# Patient Record
Sex: Male | Born: 1965 | Hispanic: No | Smoking: Never smoker
Health system: Southern US, Community
[De-identification: ages and names within clinical notes are randomized; demographics above are authoritative.]

## PROBLEM LIST (undated history)

## (undated) DIAGNOSIS — N189 Chronic kidney disease, unspecified: Secondary | ICD-10-CM

## (undated) DIAGNOSIS — I10 Essential (primary) hypertension: Secondary | ICD-10-CM

## (undated) HISTORY — PX: PATELLAR TENDON REPAIR: SHX737

## (undated) HISTORY — PX: ANTERIOR CRUCIATE LIGAMENT REPAIR: SHX115

## (undated) HISTORY — DX: Chronic kidney disease, unspecified: N18.9

---

## 2011-12-07 ENCOUNTER — Ambulatory Visit (INDEPENDENT_AMBULATORY_CARE_PROVIDER_SITE_OTHER): Payer: BC Managed Care – PPO

## 2011-12-07 DIAGNOSIS — Z7251 High risk heterosexual behavior: Secondary | ICD-10-CM

## 2011-12-07 DIAGNOSIS — A630 Anogenital (venereal) warts: Secondary | ICD-10-CM

## 2013-02-04 ENCOUNTER — Other Ambulatory Visit (HOSPITAL_COMMUNITY): Payer: Self-pay | Admitting: Orthopedic Surgery

## 2013-02-04 ENCOUNTER — Other Ambulatory Visit: Payer: Self-pay | Admitting: Orthopedic Surgery

## 2013-02-04 DIAGNOSIS — M25561 Pain in right knee: Secondary | ICD-10-CM

## 2013-02-08 ENCOUNTER — Ambulatory Visit
Admission: RE | Admit: 2013-02-08 | Discharge: 2013-02-08 | Disposition: A | Discharge status: Home / Self Care | Payer: BC Managed Care – PPO | Source: Ambulatory Visit | Attending: Orthopedic Surgery | Admitting: Orthopedic Surgery

## 2013-02-08 DIAGNOSIS — M25561 Pain in right knee: Secondary | ICD-10-CM

## 2013-05-12 ENCOUNTER — Ambulatory Visit (INDEPENDENT_AMBULATORY_CARE_PROVIDER_SITE_OTHER): Payer: BC Managed Care – PPO | Admitting: Family Medicine

## 2013-05-12 VITALS — BP 108/68 | HR 78 | Temp 98.4°F | Resp 16 | Ht 68.0 in | Wt 204.0 lb

## 2013-05-12 DIAGNOSIS — K130 Diseases of lips: Secondary | ICD-10-CM

## 2013-05-12 MED ORDER — NYSTATIN-TRIAMCINOLONE 100000-0.1 UNIT/GM-% EX OINT: TOPICAL_OINTMENT | Freq: Two times a day (BID) | CUTANEOUS | Status: DC

## 2013-05-12 NOTE — Progress Notes (Signed)
[facility]:  Office Visit  Chief Complaint:  Chief Complaint  Patient presents with  . discoloration on lip    with elevated area x 1 month, very chapped    HPI: Cole Dyer is a 47 y.o. male who complains of chapped area on upper lip x 1 month. Feels chapped and rough and looks discolored to him. Has tried chapped stick and vasoline without relief . No other sxs. No fevers, chills, LAD , unintentional weightloss or nightsweats. Has not tried any new foods, medicines, products. No h/o STD ori mmune compromise. No h/o canker sores or cold sores. Non smoker. Denies pain, numbness or tingling. He states that it just looks strange and he does not want it there.   Past Medical History  Diagnosis Date  . Chronic kidney disease    Past Surgical History  Procedure Laterality Date  . Anterior cruciate ligament repair    . Patellar tendon repair      removal of calcium deposit   History   Social History  . Marital Status: Married    Spouse Name: N/A    Number of Children: N/A  . Years of Education: N/A   Social History Main Topics  . Smoking status: Never Smoker   . Smokeless tobacco: None  . Alcohol Use: Yes  . Drug Use: No  . Sexually Active: Yes   Other Topics Concern  . None   Social History Narrative  . None   History reviewed. No pertinent family history. No Known Allergies Prior to Admission medications   Medication Sig Start Date End Date Taking? Authorizing Provider  enalapril (VASOTEC) 20 MG tablet Take 20 mg by mouth 2 (two) times daily.   Yes Historical Provider, MD  losartan-hydrochlorothiazide (HYZAAR) 100-25 MG per tablet Take 1 tablet by mouth daily.   Yes Historical Provider, MD     ROS: The patient denies fevers, chills, night sweats, unintentional weight loss, chest pain, palpitations, wheezing, dyspnea on exertion, nausea, vomiting, abdominal pain, dysuria, hematuria, melena, numbness, weakness, or tingling.  All other systems  have been reviewed and were otherwise negative with the exception of those mentioned in the HPI and as above.    PHYSICAL EXAM: Filed Vitals:   05/12/13 1651  BP: 108/68  Pulse: 78  Temp: 98.4 F (36.9 C)  Resp: 16   Filed Vitals:   05/12/13 1651  Height: 5\' 8"  (1.727 m)  Weight: 204 lb (92.534 kg)   Body mass index is 31.03 kg/(m^2).  General: Alert, no acute distress HEENT:  Normocephalic, atraumatic, oropharynx patent.  Cardiovascular:  Regular rate and rhythm, no rubs murmurs or gallops.  No Carotid bruits, radial pulse intact. No pedal edema.  Respiratory: Clear to auscultation bilaterally.  No wheezes, rales, or rhonchi.  No cyanosis, no use of accessory musculature GI: No organomegaly, abdomen is soft and non-tender, positive bowel sounds.  No masses. Skin: + minimal to non hypopigmenation of mid upper lip at junction where upper lip branches. It is slightly rough appearing, appears noninfected. No drainage.  Neurologic: Facial musculature symmetric. Psychiatric: Patient is appropriate throughout our interaction. Lymphatic: No cervical lymphadenopathy Musculoskeletal: Gait intact.   LABS: No results found for this or any previous visit.   EKG/XRAY:   Primary read interpreted by Dr. Conley Rolls at [facility].   ASSESSMENT/PLAN: Encounter Diagnosis  Name Primary?  . Lesion of lip Yes   Upon chart review patient had STD testing done about 2012  and HSV I and II IgG  were both negative.  However his sxs are somewhat similar, he had a trial of valtrex and also Mycolog ointment I will go ahead and try the mycology treatment first since the lesion on his lip does not appear to be herpetic in origin.  I suspect it may just be an ongoing chapped area that is nonhealing.  Rx Mycolog BID x 7 days Lysine otc If no improvement then he will call Will do trial of Valtrex F/u prn    LE, THAO PHUONG, DO 05/12/2013 5:50 PM

## 2013-05-13 ENCOUNTER — Encounter: Payer: Self-pay | Admitting: Family Medicine

## 2013-07-01 ENCOUNTER — Encounter (HOSPITAL_BASED_OUTPATIENT_CLINIC_OR_DEPARTMENT_OTHER): Payer: Self-pay | Admitting: *Deleted

## 2013-07-01 ENCOUNTER — Emergency Department (HOSPITAL_BASED_OUTPATIENT_CLINIC_OR_DEPARTMENT_OTHER)
Admission: EM | Admit: 2013-07-01 | Discharge: 2013-07-02 | Disposition: A | Discharge status: Home / Self Care | Payer: BC Managed Care – PPO | Attending: Emergency Medicine | Admitting: Emergency Medicine

## 2013-07-01 DIAGNOSIS — I129 Hypertensive chronic kidney disease with stage 1 through stage 4 chronic kidney disease, or unspecified chronic kidney disease: Secondary | ICD-10-CM | POA: Insufficient documentation

## 2013-07-01 DIAGNOSIS — Z79899 Other long term (current) drug therapy: Secondary | ICD-10-CM | POA: Insufficient documentation

## 2013-07-01 DIAGNOSIS — N189 Chronic kidney disease, unspecified: Secondary | ICD-10-CM | POA: Insufficient documentation

## 2013-07-01 DIAGNOSIS — I10 Essential (primary) hypertension: Secondary | ICD-10-CM

## 2013-07-01 HISTORY — DX: Essential (primary) hypertension: I10

## 2013-07-01 NOTE — ED Provider Notes (Signed)
History    CSN: [id number] Arrival date & time 07/01/13  2150  First MD Initiated Contact with Patient 07/01/13 2302     No chief complaint on file.  (Consider location/radiation/quality/duration/timing/severity/associated sxs/prior Treatment) Patient is a 47 y.o. male presenting with hypertension. The history is provided by the patient.  Hypertension This is a chronic problem. The current episode started more than 1 week ago. The problem occurs constantly. The problem has been gradually worsening. Pertinent negatives include no chest pain, no abdominal pain, no headaches and no shortness of breath. Nothing aggravates the symptoms. Nothing relieves the symptoms. He has tried nothing for the symptoms. The treatment provided no relief.  Has been exercising and took BP and was in 140s then took DHEA and vodka and was higher then got worried and went to [employer] and checked and was higher so came immediately here.  No CP, no SOB no n/v/d.  No HA Past Medical History  Diagnosis Date  . Chronic kidney disease     Focal segmental glomerulosclerosis  . Hypertension    Past Surgical History  Procedure Laterality Date  . Anterior cruciate ligament repair    . Patellar tendon repair      removal of calcium deposit   No family history on file. History  Substance Use Topics  . Smoking status: Never Smoker   . Smokeless tobacco: Not on file  . Alcohol Use: Yes    Review of Systems  Constitutional: Negative for fever.  HENT: Negative for neck pain.   Respiratory: Negative for shortness of breath.   Cardiovascular: Negative for chest pain and leg swelling.  Gastrointestinal: Negative for abdominal pain.  Neurological: Negative for facial asymmetry, speech difficulty, weakness, numbness and headaches.  All other systems reviewed and are negative.    Allergies  Review of patient's allergies indicates no known allergies.  Home Medications   Current Outpatient Rx  Name  Route  Sig   Dispense  Refill  . enalapril (VASOTEC) 20 MG tablet   Oral   Take 20 mg by mouth 2 (two) times daily.         Marland Kitchen losartan-hydrochlorothiazide (HYZAAR) 100-25 MG per tablet   Oral   Take 1 tablet by mouth daily.         Marland Kitchen nystatin-triamcinolone ointment (MYCOLOG)   Topical   Apply topically 2 (two) times daily.   30 g   0    BP 158/100  Pulse 102  Temp(Src) 98.8 F (37.1 C) (Oral)  Resp 20  Ht 5\' 8"  (1.727 m)  Wt 195 lb (88.451 kg)  BMI 29.66 kg/m2  SpO2 99% Physical Exam  Constitutional: He is oriented to person, place, and time. He appears well-developed and well-nourished. No distress.  HENT:  Head: Normocephalic and atraumatic.  Mouth/Throat: Oropharynx is clear and moist.  Eyes: Conjunctivae and EOM are normal. Pupils are equal, round, and reactive to light.  Neck: Normal range of motion. Neck supple.  Cardiovascular: Normal rate, regular rhythm and intact distal pulses.   Pulmonary/Chest: Effort normal and breath sounds normal. He has no wheezes. He has no rales.  Abdominal: Soft. Bowel sounds are normal.  Musculoskeletal: Normal range of motion. He exhibits no edema.  Neurological: He is alert and oriented to person, place, and time. He has normal reflexes.  Skin: Skin is warm and dry.  Psychiatric: He has a normal mood and affect.    ED Course  Procedures (including critical care time) Labs Reviewed  BASIC METABOLIC PANEL  URINALYSIS, ROUTINE W REFLEX MICROSCOPIC   No results found. No diagnosis found.  MDM   Date: 07/02/2013  Rate: 84  Rhythm: normal sinus rhythm  QRS Axis: normal  Intervals: normal  ST/T Wave abnormalities: normal  Conduction Disutrbances: none  Narrative Interpretation: unremarkable   Follow up with your doctor in the am return for worsening symptoms  Ovie Cornelio K Nil Bolser-Rasch, MD 07/02/13 0030

## 2013-07-01 NOTE — ED Notes (Signed)
No symptoms of HTN. States he has been trying to build muscle mass so he took [facility] and drank a shot of vodka. Afterward his BP was 140/. He thought his BP machine was broken so he went to [employer] and took his pressure. The pressure was 180/. He has taken his normal dose of BP medication for today.

## 2013-07-01 NOTE — ED Notes (Signed)
Encouraged Pt. To rest quietly and the Dr. Stann Mainland decide what to be done.  Pt. In no distress and has no c/o headache and no c/o urinary retention.

## 2013-07-01 NOTE — ED Notes (Signed)
Pt. In no distress with c/o high blood pressure.

## 2013-07-02 LAB — URINALYSIS, ROUTINE W REFLEX MICROSCOPIC
Bilirubin Urine: NEGATIVE
Hgb urine dipstick: NEGATIVE
Ketones, ur: NEGATIVE mg/dL
Specific Gravity, Urine: 1.008 (ref 1.005–1.030)
Urobilinogen, UA: 0.2 mg/dL (ref 0.0–1.0)

## 2013-07-02 LAB — BASIC METABOLIC PANEL
BUN: 12 mg/dL (ref 6–23)
Creatinine, Ser: 1.1 mg/dL (ref 0.50–1.35)
GFR calc non Af Amer: 77 mL/min — ABNORMAL LOW (ref >90)
Glucose, Bld: 85 mg/dL (ref 70–99)
Potassium: 3.2 mEq/L — ABNORMAL LOW (ref 3.5–5.1)

## 2013-07-02 MED ORDER — POTASSIUM CHLORIDE CRYS ER 20 MEQ PO TBCR
30.0000 meq | EXTENDED_RELEASE_TABLET | Freq: Once | ORAL | Status: AC
  Administered 2013-07-02: 30 meq via ORAL
  Filled 2013-07-02: qty 1

## 2013-07-20 ENCOUNTER — Ambulatory Visit (INDEPENDENT_AMBULATORY_CARE_PROVIDER_SITE_OTHER): Payer: BC Managed Care – PPO | Admitting: Family Medicine

## 2013-07-20 VITALS — BP 124/76 | HR 105 | Temp 99.5°F | Resp 16 | Ht 68.0 in | Wt 195.2 lb

## 2013-07-20 DIAGNOSIS — N453 Epididymo-orchitis: Secondary | ICD-10-CM

## 2013-07-20 DIAGNOSIS — N451 Epididymitis: Secondary | ICD-10-CM

## 2013-07-20 MED ORDER — CIPROFLOXACIN HCL 500 MG PO TABS: 500.0000 mg | ORAL_TABLET | Freq: Two times a day (BID) | ORAL | Status: DC

## 2013-07-20 NOTE — Patient Instructions (Signed)
Start antibiotic as discussed, other care as below. If not improving this week with this medicine - would check other urine test as discussed.  Return to the clinic or go to the nearest emergency room if any of your symptoms worsen or new symptoms occur. Epididymitis Epididymitis is a swelling (inflammation) of the epididymis. The epididymis is a cord-like structure along the back part of the testicle. Epididymitis is usually, but not always, caused by infection. This is usually a sudden problem beginning with chills, fever and pain behind the scrotum and in the testicle. There may be swelling and redness of the testicle. DIAGNOSIS  Physical examination will reveal a tender, swollen epididymis. Sometimes, cultures are obtained from the urine or from prostate secretions to help find out if there is an infection or if the cause is a different problem. Sometimes, blood work is performed to see if your white blood cell count is elevated and if a germ (bacterial) or viral infection is present. Using this knowledge, an appropriate medicine which kills germs (antibiotic) can be chosen by your caregiver. A viral infection causing epididymitis will most often go away (resolve) without treatment. HOME CARE INSTRUCTIONS   Hot sitz baths for 20 minutes, 4 times per day, may help relieve pain.  Only take over-the-counter or prescription medicines for pain, discomfort or fever as directed by your caregiver.  Take all medicines, including antibiotics, as directed. Take the antibiotics for the full prescribed length of time even if you are feeling better.  It is very important to keep all follow-up appointments. SEEK IMMEDIATE MEDICAL CARE IF:   You have a fever.  You have pain not relieved with medicines.  You have any worsening of your problems.  Your pain seems to come and go.  You develop pain, redness, and swelling in the scrotum and surrounding areas. MAKE SURE YOU:   Understand these  instructions.  Will watch your condition.  Will get help right away if you are not doing well or get worse. Document Released: 12/08/2000 Document Revised: 03/04/2012 Document Reviewed: 10/28/2009 [facility] Patient Information 2014 [place], [place].

## 2013-07-20 NOTE — Progress Notes (Signed)
Subjective:    Patient ID: Cole Dyer, male    DOB: Jul 23, 1966, 47 y.o.   MRN: [id number]  HPI Cole Dyer is a 47 y.o. male L testicular pain - sore in area above testicle - current sx's started about 5 days ago.  Has had intermittent soreness in past, but usually resolves quickly. No testicular swelling. No penile discharge.  Notes with sitting/standing - dull pain. No fever.   1 partner -same for partner for about 4 1/2 years. No hx of STI's.  Does have unprotected intercourse, but no new partners.    Review of Systems  Constitutional: Negative for fever and chills.  Gastrointestinal: Negative for abdominal pain.  Genitourinary: Positive for testicular pain. Negative for dysuria, urgency, frequency, hematuria, discharge, penile swelling, scrotal swelling, difficulty urinating and penile pain.  Skin: Negative for rash.       Objective:   Physical Exam  Constitutional: He is oriented to person, place, and time. He appears well-developed and well-nourished. No distress.  Pulmonary/Chest: Effort normal.  Abdominal: Soft. Bowel sounds are normal. There is no tenderness. Hernia confirmed negative in the right inguinal area and confirmed negative in the left inguinal area.  Genitourinary: Penis normal.    Right testis shows no tenderness. Left testis shows tenderness (L epididymis - mild ttp. ). Left testis shows no swelling. Circumcised. No penile tenderness. No discharge found.  Lymphadenopathy:       Right: No inguinal adenopathy present.       Left: No inguinal adenopathy present.  Neurological: He is alert and oriented to person, place, and time.  Skin: Skin is warm and dry. No rash noted.  Psychiatric: He has a normal mood and affect. His behavior is normal.       Assessment & Plan:  Cole Dyer is a 48 y.o. male Epididymitis - Plan: ciprofloxacin (CIPRO) 500 MG tablet mild. Low risk of STi's, and just urinated prior to OV.  Will treat with Cipro, but if not improving or  recurrence - would check genprobe/uriprobe to r/o sti.   Meds ordered this encounter  Medications  . ciprofloxacin (CIPRO) 500 MG tablet    Sig: Take 1 tablet (500 mg total) by mouth 2 (two) times daily.    Dispense:  20 tablet    Refill:  0   Patient Instructions  Start antibiotic as discussed, other care as below. If not improving this week with this medicine - would check other urine test as discussed.  Return to the clinic or go to the nearest emergency room if any of your symptoms worsen or new symptoms occur. Epididymitis Epididymitis is a swelling (inflammation) of the epididymis. The epididymis is a cord-like structure along the back part of the testicle. Epididymitis is usually, but not always, caused by infection. This is usually a sudden problem beginning with chills, fever and pain behind the scrotum and in the testicle. There may be swelling and redness of the testicle. DIAGNOSIS  Physical examination will reveal a tender, swollen epididymis. Sometimes, cultures are obtained from the urine or from prostate secretions to help find out if there is an infection or if the cause is a different problem. Sometimes, blood work is performed to see if your white blood cell count is elevated and if a germ (bacterial) or viral infection is present. Using this knowledge, an appropriate medicine which kills germs (antibiotic) can be chosen by your caregiver. A viral infection causing epididymitis will most often go away (resolve) without treatment. HOME CARE INSTRUCTIONS  Hot sitz baths for 20 minutes, 4 times per day, may help relieve pain.  Only take over-the-counter or prescription medicines for pain, discomfort or fever as directed by your caregiver.  Take all medicines, including antibiotics, as directed. Take the antibiotics for the full prescribed length of time even if you are feeling better.  It is very important to keep all follow-up appointments. SEEK IMMEDIATE MEDICAL CARE IF:     You have a fever.  You have pain not relieved with medicines.  You have any worsening of your problems.  Your pain seems to come and go.  You develop pain, redness, and swelling in the scrotum and surrounding areas. MAKE SURE YOU:   Understand these instructions.  Will watch your condition.  Will get help right away if you are not doing well or get worse. Document Released: 12/08/2000 Document Revised: 03/04/2012 Document Reviewed: 10/28/2009 [facility] Patient Information 2014 [place], [place].

## 2013-07-22 ENCOUNTER — Ambulatory Visit (INDEPENDENT_AMBULATORY_CARE_PROVIDER_SITE_OTHER): Payer: BC Managed Care – PPO | Admitting: Family Medicine

## 2013-07-22 ENCOUNTER — Telehealth: Payer: Self-pay | Admitting: Radiology

## 2013-07-22 DIAGNOSIS — N453 Epididymo-orchitis: Secondary | ICD-10-CM

## 2013-07-22 DIAGNOSIS — N451 Epididymitis: Secondary | ICD-10-CM

## 2013-07-22 NOTE — Telephone Encounter (Signed)
Patient has walked in to the office wanting to provide urine sample. He states he was unable to provide this at office visit 2 days ago. Please advise if he needs lab visit or return office visit.

## 2013-07-22 NOTE — Progress Notes (Signed)
  Subjective:    Patient ID: Alton Bouknight, male    DOB: [birth year], 47 y.o.   MRN: [id number]  HPI Clee Pandit is a 47 y.o. male  See ov 2 days ago -  Here to give urine sample - started cipro yesterday am - s/p 3 doses.   Still with dull pain - persistent, but not worsening.  Ran 3 miles, then walked few miles yesterday. Tolerating cipro.  Here to discuss urine test.   Review of Systems     Objective:   Physical Exam        Assessment & Plan:  Discussed plan as in ov 2 days ago, and may not be enough time to determine if improvement with abx. Advised to cut back on exercise, specifically running for now. Discussed uriprobe now that on abx and could be false negative, but again lower likelihood of chlamydia/gonorrhea. He decided against this test today. Would still consider this in next week - 10 days if not improving, sooner if worse or if recurrence - understanding expressed.

## 2013-07-23 NOTE — Telephone Encounter (Signed)
See ov notes.  Discussed with patient in office -decided against testing at that time.

## 2013-08-02 ENCOUNTER — Encounter: Payer: Self-pay | Admitting: Family Medicine

## 2013-08-02 ENCOUNTER — Ambulatory Visit (INDEPENDENT_AMBULATORY_CARE_PROVIDER_SITE_OTHER): Payer: BC Managed Care – PPO | Admitting: Family Medicine

## 2013-08-02 VITALS — BP 126/78 | HR 101 | Temp 98.0°F | Resp 16 | Ht 68.0 in | Wt 193.0 lb

## 2013-08-02 DIAGNOSIS — N451 Epididymitis: Secondary | ICD-10-CM

## 2013-08-02 DIAGNOSIS — N453 Epididymo-orchitis: Secondary | ICD-10-CM

## 2013-08-02 DIAGNOSIS — N051 Unspecified nephritic syndrome with focal and segmental glomerular lesions: Secondary | ICD-10-CM

## 2013-08-02 DIAGNOSIS — N032 Chronic nephritic syndrome with diffuse membranous glomerulonephritis: Secondary | ICD-10-CM

## 2013-08-02 MED ORDER — CIPROFLOXACIN HCL 500 MG PO TABS: 500.0000 mg | ORAL_TABLET | Freq: Two times a day (BID) | ORAL | Status: DC

## 2013-08-02 NOTE — Patient Instructions (Signed)
Restart cipro.  Try to avoid running until pain has improved for at least a few days, then slowly return to running. Return to the clinic or go to the nearest emergency room if any of your symptoms worsen or new symptoms occur. You should receive a call or letter about your lab results within the next week to 10 days.  Epididymitis Epididymitis is a swelling (inflammation) of the epididymis. The epididymis is a cord-like structure along the back part of the testicle. Epididymitis is usually, but not always, caused by infection. This is usually a sudden problem beginning with chills, fever and pain behind the scrotum and in the testicle. There may be swelling and redness of the testicle. DIAGNOSIS  Physical examination will reveal a tender, swollen epididymis. Sometimes, cultures are obtained from the urine or from prostate secretions to help find out if there is an infection or if the cause is a different problem. Sometimes, blood work is performed to see if your white blood cell count is elevated and if a germ (bacterial) or viral infection is present. Using this knowledge, an appropriate medicine which kills germs (antibiotic) can be chosen by your caregiver. A viral infection causing epididymitis will most often go away (resolve) without treatment. HOME CARE INSTRUCTIONS   Hot sitz baths for 20 minutes, 4 times per day, may help relieve pain.  Only take over-the-counter or prescription medicines for pain, discomfort or fever as directed by your caregiver.  Take all medicines, including antibiotics, as directed. Take the antibiotics for the full prescribed length of time even if you are feeling better.  It is very important to keep all follow-up appointments. SEEK IMMEDIATE MEDICAL CARE IF:   You have a fever.  You have pain not relieved with medicines.  You have any worsening of your problems.  Your pain seems to come and go.  You develop pain, redness, and swelling in the scrotum and  surrounding areas. MAKE SURE YOU:   Understand these instructions.  Will watch your condition.  Will get help right away if you are not doing well or get worse. Document Released: 12/08/2000 Document Revised: 03/04/2012 Document Reviewed: 10/28/2009 [facility] Patient Information 2014 [place], [place].

## 2013-08-02 NOTE — Progress Notes (Signed)
Subjective:    Patient ID: Cole Dyer, male    DOB: [birth year], 47 y.o.   MRN: [id number]  HPI Cole Dyer is a 47 y.o. male  Diagnosed with L epididymitis on 07/20/13, started on Cipro 500mg  BID. Deferred STI testing initially. Here for follow up.  seemed to be improving, no pain 3 days ago. Has been avoiding running, walking some past week.  Had been taking cipro twice per day. Last dose of cipro 2 days ago.  Noticed recurrence of soreness - yesterday am.  slight discomfort in L testicle, but less than initially when starting Cipro. Tx: Advil x 2.  No side effects with Cipro. Provided urine tests today.   Review of Systems  Constitutional: Negative for fever and chills.  Gastrointestinal: Negative for abdominal pain.  Genitourinary: Positive for testicular pain. Negative for dysuria, urgency, frequency, hematuria, flank pain, decreased urine volume, discharge, penile swelling, scrotal swelling, difficulty urinating, genital sores and penile pain.  Skin: Negative for rash.       Objective:   Physical Exam  Vitals reviewed. Constitutional: He appears well-developed and well-nourished.  Pulmonary/Chest: Effort normal.  Abdominal: Soft. Bowel sounds are normal. There is no tenderness. Hernia confirmed negative in the right inguinal area and confirmed negative in the left inguinal area.  Genitourinary: Penis normal. Right testis shows no mass, no swelling and no tenderness. Left testis shows tenderness (L epididymis mild ttp. ). Left testis shows no mass and no swelling.  Skin: Skin is warm.          Assessment & Plan:  Cole Dyer is a 47 y.o. male Focal segmental glomerulosclerosis  Epididymitis - Plan: GC/Chlamydia Probe Amp, ciprofloxacin (CIPRO) 500 MG tablet  initially improved with 10 days of Cipro, then recurrence off meds.  Hx of FSG, but normal creatinine recently. Restart Cipro - 14 day course, rtc precautions. tendinopathy risks discussed.   Meds ordered this  encounter  Medications  . ciprofloxacin (CIPRO) 500 MG tablet    Sig: Take 1 tablet (500 mg total) by mouth 2 (two) times daily.    Dispense:  28 tablet    Refill:  0   Patient Instructions  Restart cipro.  Try to avoid running until pain has improved for at least a few days, then slowly return to running. Return to the clinic or go to the nearest emergency room if any of your symptoms worsen or new symptoms occur. You should receive a call or letter about your lab results within the next week to 10 days.  Epididymitis Epididymitis is a swelling (inflammation) of the epididymis. The epididymis is a cord-like structure along the back part of the testicle. Epididymitis is usually, but not always, caused by infection. This is usually a sudden problem beginning with chills, fever and pain behind the scrotum and in the testicle. There may be swelling and redness of the testicle. DIAGNOSIS  Physical examination will reveal a tender, swollen epididymis. Sometimes, cultures are obtained from the urine or from prostate secretions to help find out if there is an infection or if the cause is a different problem. Sometimes, blood work is performed to see if your white blood cell count is elevated and if a germ (bacterial) or viral infection is present. Using this knowledge, an appropriate medicine which kills germs (antibiotic) can be chosen by your caregiver. A viral infection causing epididymitis will most often go away (resolve) without treatment. HOME CARE INSTRUCTIONS   Hot sitz baths for 20 minutes, 4 times per day,  may help relieve pain.  Only take over-the-counter or prescription medicines for pain, discomfort or fever as directed by your caregiver.  Take all medicines, including antibiotics, as directed. Take the antibiotics for the full prescribed length of time even if you are feeling better.  It is very important to keep all follow-up appointments. SEEK IMMEDIATE MEDICAL CARE IF:   You have  a fever.  You have pain not relieved with medicines.  You have any worsening of your problems.  Your pain seems to come and go.  You develop pain, redness, and swelling in the scrotum and surrounding areas. MAKE SURE YOU:   Understand these instructions.  Will watch your condition.  Will get help right away if you are not doing well or get worse. Document Released: 12/08/2000 Document Revised: 03/04/2012 Document Reviewed: 10/28/2009 [facility] Patient Information 2014 [place], [place].

## 2013-08-03 LAB — GC/CHLAMYDIA PROBE AMP: CT Probe RNA: NEGATIVE

## 2013-12-29 ENCOUNTER — Ambulatory Visit: Payer: BC Managed Care – PPO

## 2014-01-16 ENCOUNTER — Ambulatory Visit (INDEPENDENT_AMBULATORY_CARE_PROVIDER_SITE_OTHER): Payer: BC Managed Care – PPO | Admitting: Family Medicine

## 2014-01-16 VITALS — BP 106/76 | HR 91 | Temp 98.9°F | Resp 16 | Ht 67.5 in | Wt 204.6 lb

## 2014-01-16 DIAGNOSIS — N4889 Other specified disorders of penis: Secondary | ICD-10-CM

## 2014-01-16 DIAGNOSIS — E119 Type 2 diabetes mellitus without complications: Secondary | ICD-10-CM

## 2014-01-16 LAB — POCT UA - MICROSCOPIC ONLY
Bacteria, U Microscopic: NEGATIVE
CASTS, UR, LPF, POC: NEGATIVE
CRYSTALS, UR, HPF, POC: NEGATIVE
MUCUS UA: NEGATIVE
YEAST UA: NEGATIVE

## 2014-01-16 LAB — POCT URINALYSIS DIPSTICK
Bilirubin, UA: NEGATIVE
GLUCOSE UA: NEGATIVE
Ketones, UA: NEGATIVE
Leukocytes, UA: NEGATIVE
NITRITE UA: NEGATIVE
PROTEIN UA: NEGATIVE
RBC UA: NEGATIVE
Spec Grav, UA: <=1.005
UROBILINOGEN UA: 0.2
pH, UA: 5.5

## 2014-01-16 LAB — GLUCOSE, POCT (MANUAL RESULT ENTRY): POC GLUCOSE: 121 mg/dL — AB (ref 70–99)

## 2014-01-16 NOTE — Patient Instructions (Signed)
I will let you know the results of the remainder of the tests in a few days   Use over-the-counter 1% hydrocortisone cream around the tip of the penis twice daily for one week

## 2014-01-16 NOTE — Progress Notes (Signed)
Subjective: Patient been having malrotation. Started in church on Sunday, come down for a few days, and again the last couple of days has been being bothered. It is an itching discomfort around the opening of the penis. No discharge there are visible rash. Has a history of one urinary tract infection years ago. No history of STDs. Has been with same woman for 6 years and considers the relationship to bea faithful one.  He apparently has history of diabetes with an A1c of about 6.6. He has an appointment elsewhere in a few days.  Objective: Penis appears entirely normal. No rash visible.  Assessment: Penile irritation Type 2 diabetes mellitus  Plan: Urinalysis Uriprobe Check his sugar. HSV  It could be he has a little yeast there. I don't see anything else.  Results for orders placed in visit on 01/16/14  GLUCOSE, POCT (MANUAL RESULT ENTRY)      Result Value Range   POC Glucose 121 (*) 70 - 99 mg/dl  POCT UA - MICROSCOPIC ONLY      Result Value Range   WBC, Ur, HPF, POC 0-1     RBC, urine, microscopic 0-1     Bacteria, U Microscopic neg     Mucus, UA neg     Epithelial cells, urine per micros 0-1     Crystals, Ur, HPF, POC neg     Casts, Ur, LPF, POC neg     Yeast, UA neg    POCT URINALYSIS DIPSTICK      Result Value Range   Color, UA yellow     Clarity, UA clear     Glucose, UA neg     Bilirubin, UA neg     Ketones, UA neg     Spec Grav, UA <=1.005     Blood, UA neg     pH, UA 5.5     Protein, UA neg     Urobilinogen, UA 0.2     Nitrite, UA neg     Leukocytes, UA Negative     This is a nonspecific irritation of the penis If it is getting worse or something changing he is to return. I think he works as go away in not have any consequence.

## 2014-01-17 LAB — GC/CHLAMYDIA PROBE AMP
CT Probe RNA: NEGATIVE
GC Probe RNA: NEGATIVE

## 2014-01-20 LAB — HSV(HERPES SIMPLEX VRS) I + II AB-IGG
HSV 1 Glycoprotein G Ab, IgG: <0.1 IV
HSV 2 GLYCOPROTEIN G AB, IGG: 0.17 IV

## 2014-01-22 ENCOUNTER — Encounter: Payer: Self-pay | Admitting: Family Medicine

## 2014-06-28 ENCOUNTER — Ambulatory Visit (INDEPENDENT_AMBULATORY_CARE_PROVIDER_SITE_OTHER): Payer: BC Managed Care – PPO

## 2014-06-28 ENCOUNTER — Ambulatory Visit (INDEPENDENT_AMBULATORY_CARE_PROVIDER_SITE_OTHER): Payer: BC Managed Care – PPO | Admitting: Emergency Medicine

## 2014-06-28 VITALS — BP 102/64 | HR 94 | Temp 97.9°F | Resp 18 | Ht 68.0 in | Wt 205.0 lb

## 2014-06-28 DIAGNOSIS — M545 Low back pain, unspecified: Secondary | ICD-10-CM

## 2014-06-28 DIAGNOSIS — N051 Unspecified nephritic syndrome with focal and segmental glomerular lesions: Secondary | ICD-10-CM

## 2014-06-28 DIAGNOSIS — N032 Chronic nephritic syndrome with diffuse membranous glomerulonephritis: Secondary | ICD-10-CM

## 2014-06-28 LAB — BASIC METABOLIC PANEL WITH GFR
BUN: 19 mg/dL (ref 6–23)
CALCIUM: 9.8 mg/dL (ref 8.4–10.5)
CHLORIDE: 99 meq/L (ref 96–112)
CO2: 23 meq/L (ref 19–32)
Creat: 1.29 mg/dL (ref 0.50–1.35)
GFR, Est African American: 74 mL/min
GFR, Est Non African American: 64 mL/min
GLUCOSE: 100 mg/dL — AB (ref 70–99)
Potassium: 4.7 mEq/L (ref 3.5–5.3)
SODIUM: 137 meq/L (ref 135–145)

## 2014-06-28 LAB — URIC ACID: URIC ACID, SERUM: 7.4 mg/dL (ref 4.0–7.8)

## 2014-06-28 MED ORDER — HYDROCODONE-ACETAMINOPHEN 5-325 MG PO TABS: 1.0000 | ORAL_TABLET | Freq: Four times a day (QID) | ORAL | Status: AC | PRN

## 2014-06-28 MED ORDER — METHOCARBAMOL 750 MG PO TABS: 750.0000 mg | ORAL_TABLET | Freq: Four times a day (QID) | ORAL | Status: AC

## 2014-06-28 NOTE — Patient Instructions (Signed)
Degenerative Disk Disease °Degenerative disk disease is a condition caused by the changes that occur in the cushions of the backbone (spinal disks) as you grow older. Spinal disks are soft and compressible disks located between the bones of the spine (vertebrae). They act like shock absorbers. Degenerative disk disease can affect the whole spine. However, the neck and lower back are most commonly affected. Many changes can occur in the spinal disks with aging, such as: °· The spinal disks may dry and shrink. °· Small tears may occur in the tough, outer covering of the disk (annulus). °· The disk space may become smaller due to loss of water. °· Abnormal growths in the bone (spurs) may occur. This can put pressure on the nerve roots exiting the spinal canal, causing pain. °· The spinal canal may become narrowed. °CAUSES  °Degenerative disk disease is a condition caused by the changes that occur in the spinal disks with aging. The exact cause is not known, but there is a genetic basis for many patients. Degenerative changes can occur due to loss of fluid in the disk. This makes the disk thinner and reduces the space between the backbones. Small cracks can develop in the outer layer of the disk. This can lead to the breakdown of the disk. You are more likely to get degenerative disk disease if you are overweight. Smoking cigarettes and doing heavy work such as weightlifting can also increase your risk of this condition. Degenerative changes can start after a sudden injury. Growth of bone spurs can compress the nerve roots and cause pain.  °SYMPTOMS  °The symptoms vary from person to person. Some people may have no pain, while others have severe pain. The pain may be so severe that it can limit your activities. The location of the pain depends on the part of your backbone that is affected. You will have neck or arm pain if a disk in the neck area is affected. You will have pain in your back, buttocks, or legs if a disk  in the lower back is affected. The pain becomes worse while bending, reaching up, or with twisting movements. The pain may start gradually and then get worse as time passes. It may also start after a major or minor injury. You may feel numbness or tingling in the arms or legs.  °DIAGNOSIS  °Your caregiver will ask you about your symptoms and about activities or habits that may cause the pain. He or she may also ask about any injuries, diseases, or treatments you have had earlier. Your caregiver will examine you to check for the range of movement that is possible in the affected area, to check for strength in your extremities, and to check for sensation in the areas of the arms and legs supplied by different nerve roots. An X-ray of the spine may be taken. Your caregiver may suggest other imaging tests, such as magnetic resonance imaging (MRI), if needed.  °TREATMENT  °Treatment includes rest, modifying your activities, and applying ice and heat. Your caregiver may prescribe medicines to reduce your pain and may ask you to do some exercises to strengthen your back. In some cases, you may need surgery. You and your caregiver will decide on the treatment that is best for you. °HOME CARE INSTRUCTIONS  °· Follow proper lifting and walking techniques as advised by your caregiver. °· Maintain good posture. °· Exercise regularly as advised. °· Perform relaxation exercises. °· Change your sitting, standing, and sleeping habits as advised. Change positions   frequently. °· Lose weight as advised. °· Stop smoking if you smoke. °· Wear supportive footwear. °SEEK MEDICAL CARE IF:  °Your pain does not go away within 1 to 4 weeks. °SEEK IMMEDIATE MEDICAL CARE IF:  °· Your pain is severe. °· You notice weakness in your arms, hands, or legs. °· You begin to lose control of your bladder or bowel movements. °MAKE SURE YOU:  °· Understand these instructions. °· Will watch your condition. °· Will get help right away if you are not doing  well or get worse. °Document Released: 10/08/2007 Document Revised: 03/04/2012 Document Reviewed: 10/08/2007 °ExitCare® Patient Information ©2015 [employer]. This information is not intended to replace advice given to you by your health care provider. Make sure you discuss any questions you have with your health care provider. ° °

## 2014-06-28 NOTE — Progress Notes (Signed)
   Subjective:    Patient ID: Cole MeadGerry Dorning, male    DOB: 01/05/1966, 48 y.o.   MRN: [id number]  HPI Patient presents with one and one half week history of low back pain. Pain began after sitting for a while, felt a catch[place]. Pain resolved some after initial onset and was able to run a couple miles. Now painful, but pain is intermittent, has used Advil with some relief. BUN 22, states has to be cautious with antiinflammatories due to kidney function has medical history of Focal segmental glomerulosclerosis. States his lumbar spine feels tight in the mornings, painful with changes of position.  Dr Casimiro NeedleAlvin Powell takes care of his kidneys. States he wants to have his uric acid levels checked.    Review of Systems  Constitutional: Negative.   HENT: Negative.   Eyes: Negative.   Respiratory: Negative.   Cardiovascular: Negative.   Gastrointestinal: Negative.   Genitourinary: Negative.   Musculoskeletal: Positive for back pain.       Low back painful denies radicular pain  Skin: Negative.   Allergic/Immunologic: Negative.   Neurological: Negative.   Hematological: Negative.   Psychiatric/Behavioral: Negative.        Objective:   Physical Exam Healthy appearing 48 year old male, seated on exam table, uncomfortable with changes of position, describes back as tight. Negative SLR bilateral. Reflexes equal. No lower extremity weakness. Good range of motion of lumbar pain, complains of low back pain with flexion and extension.  [facility] reading (PRIMARY) by  Dr.Ousmane Seeman there is degenerative disc disease at L5-S1 with a large osteophyte present .      Assessment & Plan:  Patient has significant degenerative disc disease at L5-S1 with a large osteophyte. Would treat with Robaxin and hydrocodone for pain. He has renal disease and should not be on a nonsteroidal. Uric acid was done at his request .

## 2014-06-29 ENCOUNTER — Ambulatory Visit: Payer: Self-pay

## 2014-06-29 ENCOUNTER — Encounter: Payer: Self-pay | Admitting: Radiology

## 2014-06-29 ENCOUNTER — Telehealth: Payer: Self-pay | Admitting: Radiology

## 2014-06-29 DIAGNOSIS — R937 Abnormal findings on diagnostic imaging of other parts of musculoskeletal system: Secondary | ICD-10-CM

## 2014-06-29 NOTE — Telephone Encounter (Signed)
Left message for patient to call back about his xray report. He needs additional imaging. Report indicates he has a sclerotic lesion above his symphysis that is incompletely imaged, needs pelvis film.

## 2014-07-01 ENCOUNTER — Telehealth: Payer: Self-pay | Admitting: Radiology

## 2014-07-01 NOTE — Telephone Encounter (Signed)
Left message on patient's voice mail and advised him to come to the office for pelvic x-ray on Friday when Dr. Cleta Albertsaub is here.

## 2014-07-06 ENCOUNTER — Encounter: Payer: Self-pay | Admitting: *Deleted

## 2014-07-06 ENCOUNTER — Encounter: Payer: Self-pay | Admitting: Radiology

## 2014-07-06 NOTE — Telephone Encounter (Signed)
Letter sent. Patient not responding to phone calls. Certified letter per Dr Cleta Albertsaub.

## 2014-07-06 NOTE — Telephone Encounter (Signed)
Message copied by Johnnette LitterARDWELL, Gustava Berland M on Mon Jul 06, 2014  3:58 PM ------      Message from: Lesle ChrisAUB, STEVEN A      Created: Sat Jul 04, 2014  9:28 AM       Please call patient and see why he has not come back for his pelvic film. If difficulty in contacting the patient and send a letter. He needs to return for further views of the pelvis.      ----- Message -----         From: SYSTEM         Sent: 06/30/2014  12:01 AM           To: Collene GobbleSteven A Daub, MD                   ------

## 2014-07-06 NOTE — Telephone Encounter (Signed)
Cole Dyer sent him a certified letter last week asking him to RTC for pelvic films.

## 2014-07-06 NOTE — Telephone Encounter (Signed)
Unable to LM on home number--cell number said some one elses name on the VM.

## 2014-07-09 ENCOUNTER — Ambulatory Visit (INDEPENDENT_AMBULATORY_CARE_PROVIDER_SITE_OTHER): Payer: BC Managed Care – PPO | Admitting: Family Medicine

## 2014-07-09 ENCOUNTER — Ambulatory Visit (INDEPENDENT_AMBULATORY_CARE_PROVIDER_SITE_OTHER): Payer: BC Managed Care – PPO

## 2014-07-09 VITALS — BP 136/86 | HR 93 | Temp 97.8°F | Resp 16 | Ht 68.0 in | Wt 208.8 lb

## 2014-07-09 DIAGNOSIS — R059 Cough, unspecified: Secondary | ICD-10-CM

## 2014-07-09 DIAGNOSIS — R072 Precordial pain: Secondary | ICD-10-CM

## 2014-07-09 DIAGNOSIS — R091 Pleurisy: Secondary | ICD-10-CM

## 2014-07-09 DIAGNOSIS — J069 Acute upper respiratory infection, unspecified: Secondary | ICD-10-CM

## 2014-07-09 DIAGNOSIS — R05 Cough: Secondary | ICD-10-CM

## 2014-07-09 LAB — CBC WITH DIFFERENTIAL/PLATELET
Basophils Absolute: 0.1 10*3/uL (ref 0.0–0.1)
Basophils Relative: 1 % (ref 0–1)
EOS ABS: 0.2 10*3/uL (ref 0.0–0.7)
EOS PCT: 4 % (ref 0–5)
HEMATOCRIT: 40.4 % (ref 39.0–52.0)
HEMOGLOBIN: 14.5 g/dL (ref 13.0–17.0)
LYMPHS ABS: 1.8 10*3/uL (ref 0.7–4.0)
LYMPHS PCT: 34 % (ref 12–46)
MCH: 31.5 pg (ref 26.0–34.0)
MCHC: 35.9 g/dL (ref 30.0–36.0)
MCV: 87.8 fL (ref 78.0–100.0)
MONO ABS: 0.8 10*3/uL (ref 0.1–1.0)
MONOS PCT: 16 % — AB (ref 3–12)
Neutro Abs: 2.3 10*3/uL (ref 1.7–7.7)
Neutrophils Relative %: 45 % (ref 43–77)
PLATELETS: 214 10*3/uL (ref 150–400)
RBC: 4.6 MIL/uL (ref 4.22–5.81)
RDW: 13.5 % (ref 11.5–15.5)
WBC: 5.2 10*3/uL (ref 4.0–10.5)

## 2014-07-09 LAB — POCT SEDIMENTATION RATE: POCT SED RATE: 11 mm/hr (ref 0–22)

## 2014-07-09 MED ORDER — ALBUTEROL SULFATE (2.5 MG/3ML) 0.083% IN NEBU
2.5000 mg | INHALATION_SOLUTION | Freq: Once | RESPIRATORY_TRACT | Status: AC
  Administered 2014-07-09: 2.5 mg via RESPIRATORY_TRACT

## 2014-07-09 MED ORDER — MUCINEX DM MAXIMUM STRENGTH 60-1200 MG PO TB12: 1.0000 | ORAL_TABLET | Freq: Every day | ORAL | Status: AC

## 2014-07-09 MED ORDER — HYDROCOD POLST-CHLORPHEN POLST 10-8 MG/5ML PO LQCR: 5.0000 mL | Freq: Two times a day (BID) | ORAL | Status: AC | PRN

## 2014-07-09 MED ORDER — OXYMETAZOLINE HCL 0.05 % NA SOLN: 3.0000 | Freq: Two times a day (BID) | NASAL | Status: AC

## 2014-07-09 NOTE — Patient Instructions (Signed)
Pleurodynia °Pleurodynia is a sharp pain in the muscles between your ribs (intercostal muscles). This condition makes it painful to breathe. Pleurodynia is sometimes described as an iron grip around the rib cage. Pleurodynia attacks are unpredictable. °CAUSES  °Pleurodynia is commonly caused by a viral infection. A virus, called coxsackievirus B, attacks the intercostal muscles. However, getting pleurodynia from this virus is rare. Most people infected with coxsackievirus B have no symptoms. In some people, the virus causes a mild sore throat, cough, or diarrhea. Coxsackievirus B can live in body fluids, such as saliva and mucus. It is easily spread from person to person through coughing or sneezing. Coming in contact with the stool of an infected person can also spread the virus. °SYMPTOMS  °Symptoms usually start 3 to 6 days after you have been infected with the virus. Very bad chest pain is the main symptom of pleurodynia. The pain is usually felt on one side of the body, along the lower ribs. It starts suddenly and may last from a few seconds to 1 minute. It is hard to breathe when the pain strikes. You might feel pain again a few minutes or hours later. In most cases, the pain keeps coming back for 3 to 5 days. Then it goes away. In some cases, the pain keeps coming back every so often for up to 1 month. °Other symptoms of pleurodynia may include:  °· Fever. °· Rapid heartbeat. °· Sore throat. °· Cough. °· Headache. °· Stomach pain. °· Nausea. °· Vomiting. °· Diarrhea. °· Feeling very tired. °· Rash. °· For males, pain in the testicles. °DIAGNOSIS  °If you have had very bad chest pain, your caregiver will probably order some tests to determine whether you have pleurodynia. These tests may include: °· A throat swab. Your caregiver may rub the back of your throat with a cotton swab. The cotton swab can then be tested for coxsackievirus B. °· Urine and stool samples. These samples will be tested for coxsackievirus  B. °· Blood tests. These tests can tell if you have muscle damage. °· Chest X-rays. °· Electrocardiography (ECG). This test checks your heartbeat. °TREATMENT  °There is no treatment for an infection caused by coxsackievirus B. However, pleurodynia usually goes away on its own. It may take up to 1 month to fully recover. You may be given nonsteroidal anti-inflammatory drugs (NSAIDs) to control your pain. If your chest pain continues, you may need to see a pain specialist to discuss possibly using nerve block injections to relieve your pain. °HOME CARE INSTRUCTIONS °· Only take over-the-counter or prescription medicines for pain, fever, or discomfort as directed by your caregiver. °· Return to your regular activities slowly. °· Wash your hands often. This helps prevent coxsackievirus B from spreading. °· Do not smoke. °· Keep all follow-up appointments as directed by your caregiver. °SEEK MEDICAL CARE IF: °· You have new symptoms. °· Your symptoms are getting worse. °· You develop a cough. °· You have a sore throat. °· You have a rash. °· You have abdominal pain. °· You vomit. °· You have diarrhea. °SEEK IMMEDIATE MEDICAL CARE IF: °· You have very bad chest pain that is getting worse. °· You have trouble breathing. °· You have a fever. °MAKE SURE YOU: °· Understand these instructions. °· Will watch your condition. °· Will get help right away if you are not doing well or get worse. °Document Released: 11/30/2011 Document Revised: 03/04/2012 Document Reviewed: 11/30/2011 °ExitCare® Patient Information ©2015 [employer]. This information is not   intended to replace advice given to you by your health care provider. Make sure you discuss any questions you have with your health care provider. ° °

## 2014-07-09 NOTE — Progress Notes (Addendum)
Subjective:  This chart was scribed for Cole Sorenson, MD by Charline Bills, ED Scribe. The patient was seen in room 3. Patient's care was started at 2:22 PM.   Patient ID: Cole Dyer, male    DOB: 26-Feb-1966, 48 y.o.   MRN: [id number]  Chief Complaint  Patient presents with  . Cough    x3days, noticed this morning the cough was getting worse, very painful. Dry cough.   . Headache    x3 days, slight headache   HPI HPI Comments: Cole Dyer is a 48 y.o. male, with a h/o HTN and chronic kidney disease, who presents to the [facility] complaining of dry cough onset yesterday. Pt first noted cold-like symptoms 07/01/14.  Pt reports an intermittent burning sensation in his chest with coughing first noted yesterday but states that the pain resolved this morning after coughing. Pt states that cough is worse at night. He also reports associated mild HA and congestion. He denies SOB, wheezing, palpitations, sweating, back pain, light-headedness, dizziness, sore throat, indigestion, change in appetite, sleep disturbance, hoarseness, metallic taste in mouth. Pt has taken Halls cough drops for relief. Pt is a nonsmoker. No sick contacts.  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 Mr. Debruyne exercises [employer] daily weight lifting.  Past Medical History  Diagnosis Date  . Chronic kidney disease     Focal segmental glomerulosclerosis  . Hypertension    Current Outpatient Prescriptions on File Prior to Visit  Medication Sig Dispense Refill  . enalapril (VASOTEC) 20 MG tablet Take 20 mg by mouth 2 (two) times daily.      Marland Kitchen HYDROcodone-acetaminophen (NORCO) 5-325 MG per tablet Take 1 tablet by mouth every 6 (six) hours as needed.  16 tablet  0  . losartan-hydrochlorothiazide (HYZAAR) 100-25 MG per tablet Take 1 tablet by mouth daily.      . methocarbamol (ROBAXIN) 750  MG tablet Take 1 tablet (750 mg total) by mouth 4 (four) times daily.  30 tablet  0   No current facility-administered medications on file prior to visit.   No Known Allergies  Review of Systems  Constitutional: Negative for fever, chills, diaphoresis and appetite change.  HENT: Positive for congestion. Negative for sore throat and voice change.   Eyes: Negative for visual disturbance.  Respiratory: Positive for cough. Negative for shortness of breath and wheezing.   Cardiovascular: Positive for chest pain (resolved). Negative for palpitations and leg swelling.  Gastrointestinal: Negative for abdominal pain.  Musculoskeletal: Negative for back pain.  Skin: Negative for rash.  Neurological: Positive for headaches (mild). Negative for dizziness, light-headedness and numbness.  Psychiatric/Behavioral: Negative for sleep disturbance.   Triage Vitals: P 136/86  Pulse 93  Temp(Src) 97.8 F (36.6 C)  Resp 16  Ht 5\' 8"  (1.727 m)  Wt 208 lb 12.8 oz (94.711 kg)  BMI 31.76 kg/m2  SpO2 98%    Objective:   Physical Exam  Nursing note and vitals reviewed. Constitutional: He is oriented to person, place, and time. He appears well-developed and well-nourished.  HENT:  Head: Normocephalic and atraumatic.  Right Ear: Tympanic membrane is erythematous and retracted.  Left Ear: Tympanic membrane normal.  Nose: Mucosal edema and rhinorrhea present.  Mouth/Throat: Oropharynx is clear and moist.  R TM: Canal erythema  Eyes: Conjunctivae and EOM are normal.  Neck: Neck supple. No mass and no thyromegaly present.  Cardiovascular: Normal rate, regular rhythm, S1 normal, S2 normal and normal heart sounds.   No murmur heard. Pulmonary/Chest: Effort normal and breath sounds normal.  Lungs clear Good air movement throughout  Musculoskeletal: Normal range of motion.  Lymphadenopathy:       Head (right side): Tonsillar adenopathy present.       Head (left side): Tonsillar adenopathy present.    He  has no cervical adenopathy.  Neurological: He is alert and oriented to person, place, and time.  Skin: Skin is warm and dry.  Psychiatric: He has a normal mood and affect. His behavior is normal.    Predicted peak flow 615.  Pt peak flow 350 -> 375 after in office neb CXR: normal    EKG: NSR, no ischemic changes Assessment & Plan:  Cough - Plan: albuterol (PROVENTIL) (2.5 MG/3ML) 0.083% nebulizer solution 2.5 mg, DG Chest 2 View, CBC with Differential, POCT SEDIMENTATION RATE, EKG 12-Lead  Acute URI - viral, pt reassured and symptomatic care for now.  Pleuritis - cannot do nsaids due to h/o FSGS (though renal function fine now) so recommend decreasing cough and reassurance that this viral inflammation as a cause of chest pain will pass. If sxs persist or become bothersome, ok to call in prednisone 10mg  6d taper and zpack.  [facility] or [facility] occur or CP changes in nature, recheck in clinic.  Precordial pain  Meds ordered this encounter  Medications  . albuterol (PROVENTIL) (2.5 MG/3ML) 0.083% nebulizer solution 2.5 mg    Sig:   . chlorpheniramine-HYDROcodone (TUSSIONEX PENNKINETIC ER) 10-8 MG/5ML LQCR    Sig: Take 5 mLs by mouth every 12 (twelve) hours as needed.    Dispense:  120 mL    Refill:  0  . oxymetazoline (AFRIN NASAL SPRAY) 0.05 % nasal spray    Sig: Place 3 sprays into both nostrils 2 (two) times daily. x3d only    Dispense:  15 mL    Refill:  0  . Dextromethorphan-Guaifenesin (MUCINEX DM MAXIMUM STRENGTH) 60-1200 MG TB12    Sig: Take 1 tablet by mouth daily with breakfast.    Dispense:  14 each    Refill:  0    I personally performed the services described in this documentation, which was scribed in my presence. The recorded information has been reviewed and considered, and addended by me as needed.  Cole SorensonEva Korrey Schleicher, MD MPH

## 2014-08-18 ENCOUNTER — Other Ambulatory Visit: Payer: Self-pay | Admitting: Orthopedic Surgery

## 2014-08-18 DIAGNOSIS — I369 Nonrheumatic tricuspid valve disorder, unspecified: Secondary | ICD-10-CM

## 2014-08-20 ENCOUNTER — Telehealth: Payer: Self-pay

## 2014-08-20 NOTE — Telephone Encounter (Signed)
Called and told him they were ready for pick up.

## 2014-08-20 NOTE — Telephone Encounter (Signed)
Patient is requesting a copy of his X-rays. Please call [phone] when ready for pick up

## 2014-08-24 ENCOUNTER — Other Ambulatory Visit: Payer: BC Managed Care – PPO

## 2014-08-24 ENCOUNTER — Ambulatory Visit
Admission: RE | Admit: 2014-08-24 | Discharge: 2014-08-24 | Disposition: A | Discharge status: Home / Self Care | Payer: BC Managed Care – PPO | Source: Ambulatory Visit | Attending: Orthopedic Surgery | Admitting: Orthopedic Surgery

## 2014-08-24 DIAGNOSIS — I369 Nonrheumatic tricuspid valve disorder, unspecified: Secondary | ICD-10-CM

## 2015-03-22 ENCOUNTER — Ambulatory Visit: Payer: BC Managed Care – PPO | Admitting: Dietician

## 2015-04-01 IMAGING — CT CT PELVIS W/O CM
2 of 3 series · 16 of 42 positions shown, 19 images · non-contrast
Comparison: None.

CLINICAL DATA: Lesion in the right hemipelvis by a recent plain
film of the lumbar spine.

EXAM:
CT PELVIS WITHOUT CONTRAST
TECHNIQUE: Multidetector CT imaging of the pelvis was performed following the
standard protocol without intravenous contrast.

[Series 3: pelvis standard · axial · 0.64mm/px · z∈[-277,-17]mm · 13 of 99 slices shown, 16 images]
[im 7/99  soft-tissue]
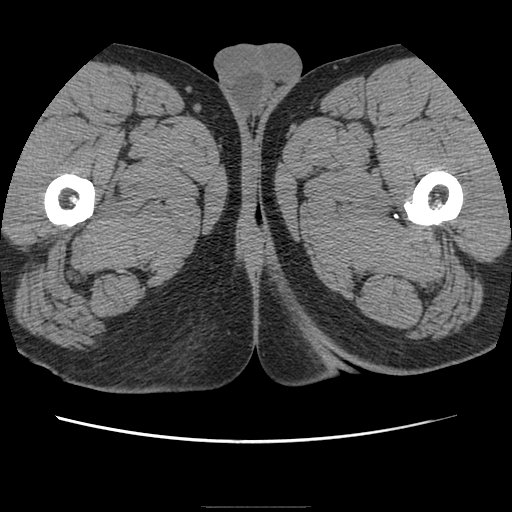
[im 7/99  bone]
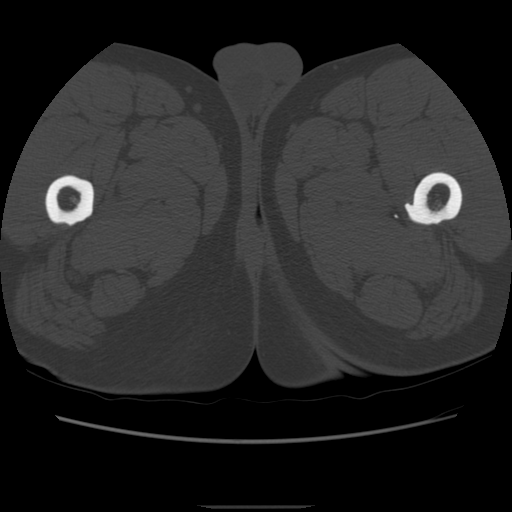
[im 16/99  soft-tissue]
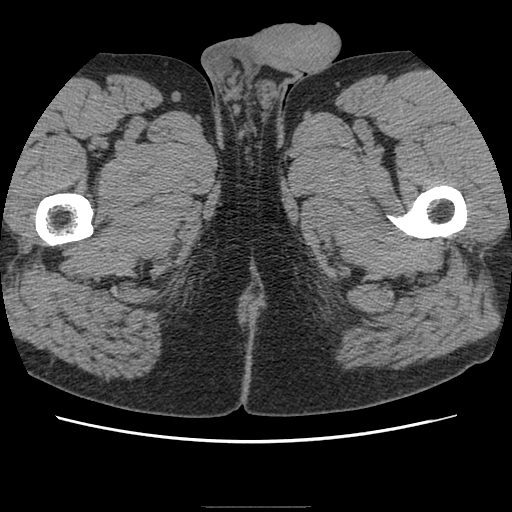
[im 26/99  soft-tissue]
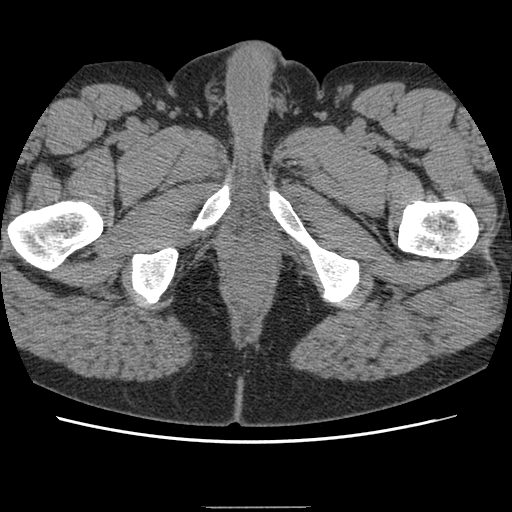
[im 35/99  soft-tissue]
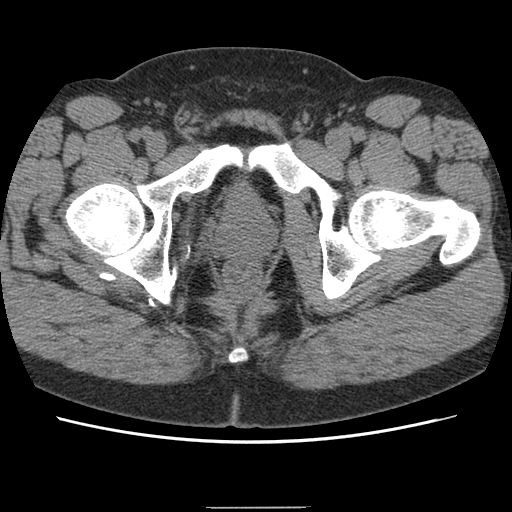
[im 45/99  soft-tissue]
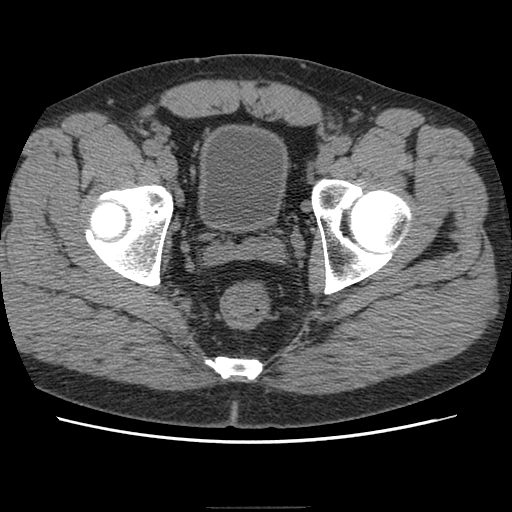
[im 54/99  soft-tissue]
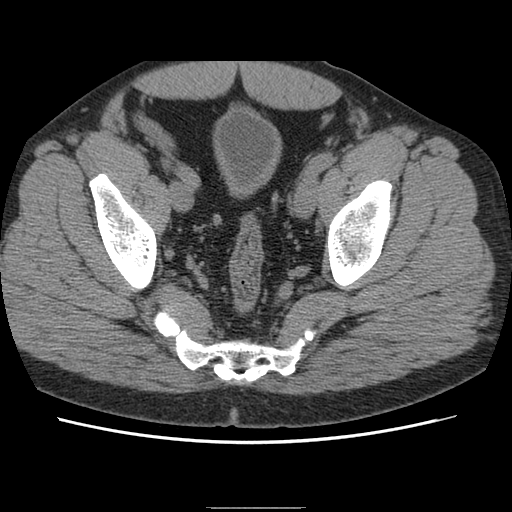
[im 64/99  soft-tissue]
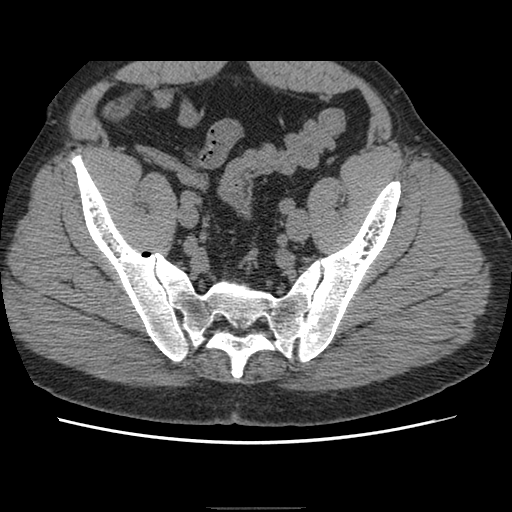
[im 73/99  soft-tissue]
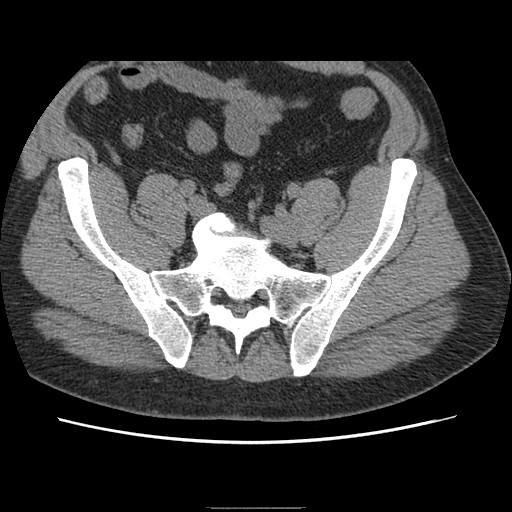
[im 83/99  soft-tissue]
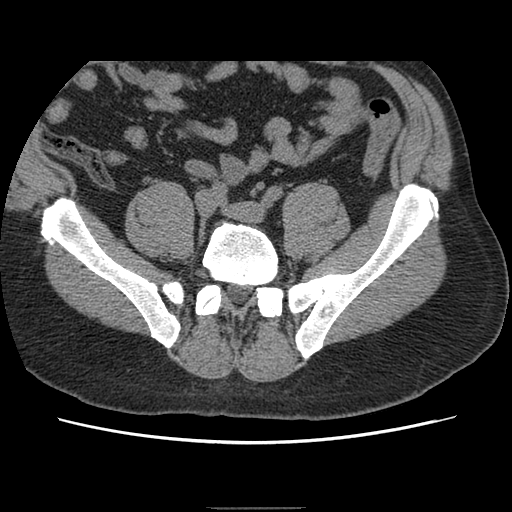
[im 83/99  bone]
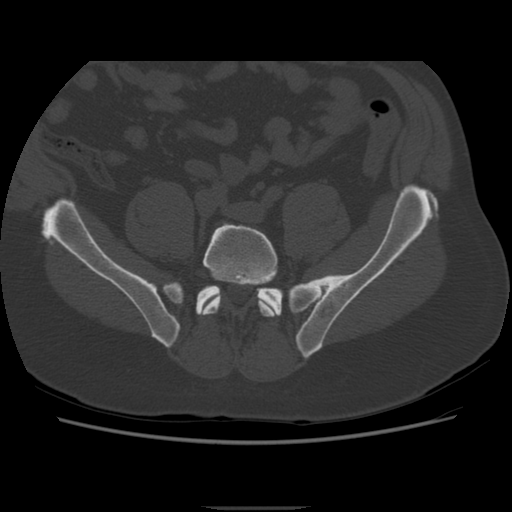
[im 86/99  lung]
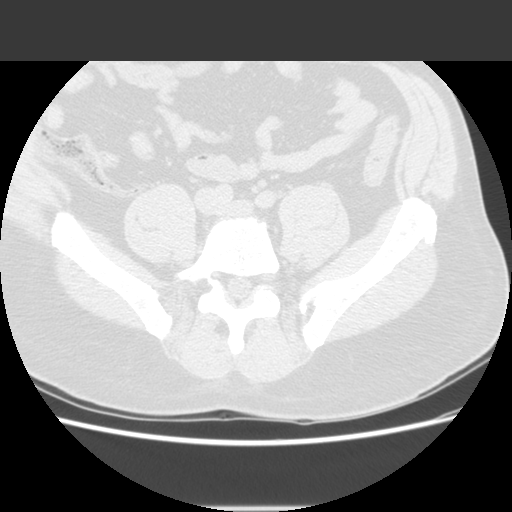
[im 89/99  lung]
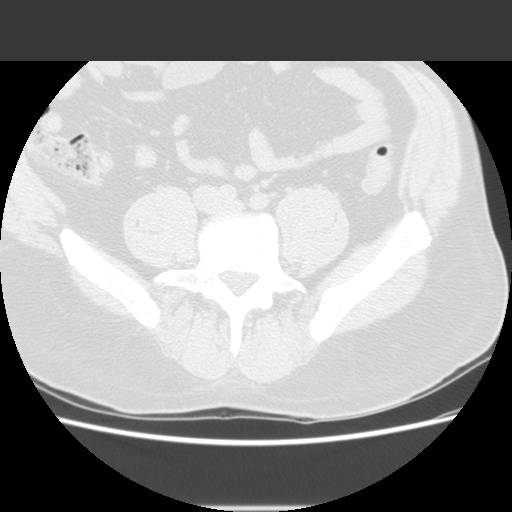
[im 92/99  soft-tissue]
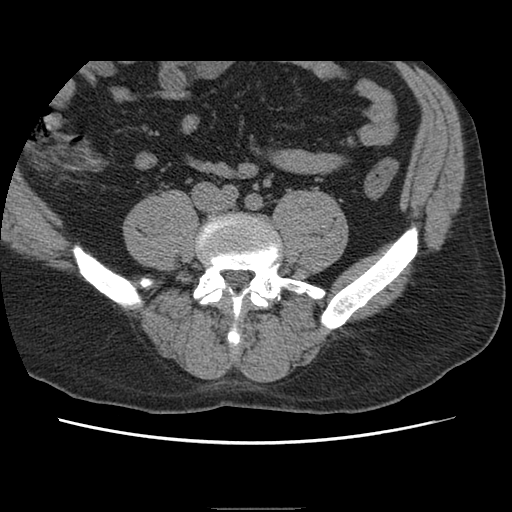
[im 92/99  lung]
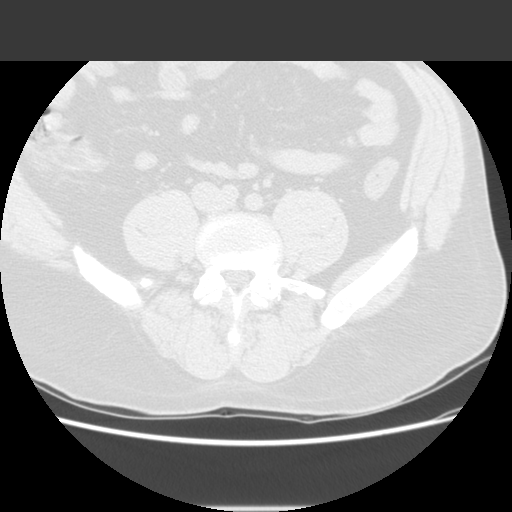
[im 95/99  lung]
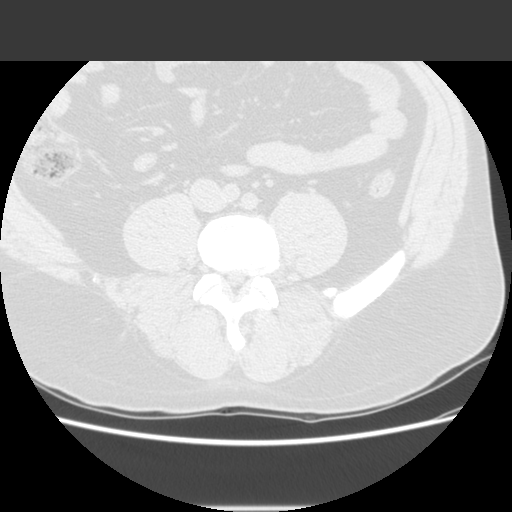

[Series 401: sag · sagittal · 0.64mm/px · 3 of 165 slices shown]
[im 33/165  soft-tissue]
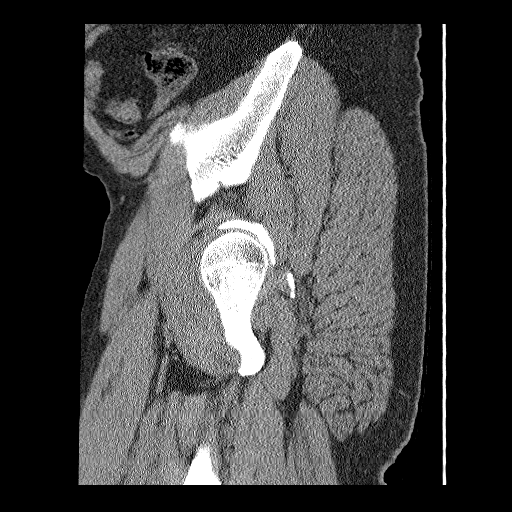
[im 66/165  soft-tissue]
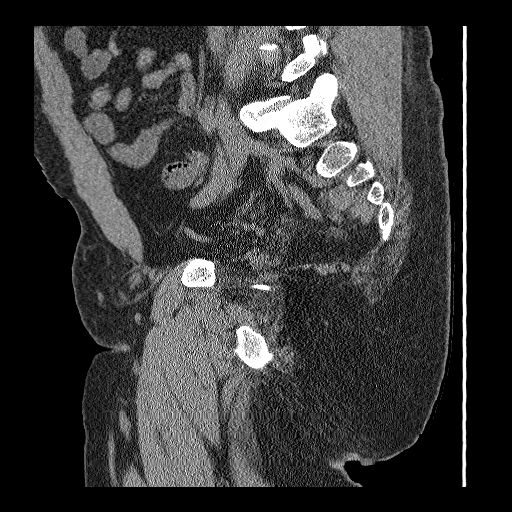
[im 99/165  soft-tissue]
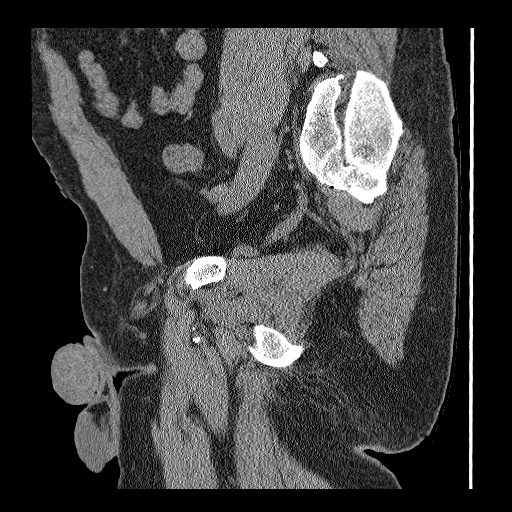

[16 of 42 positions shown; findings below may reference images not displayed]

FINDINGS: There is no evidence of a neoplastic process. Extensive heterotopic
calcification is seen throughout the right obturator internus
accounting for finding on plain film. The muscle belly of the right
obturator internus is markedly atrophied and fatty replaced. No
evidence of neoplastic process identified. Both hips are located. No
avascular necrosis of the femoral heads is seen. There is no
fracture Disc bulge is seen at L5-S1 but the central canal and
foramina appear open. Imaged intrapelvic contents are unremarkable.
Degenerative disc disease L5-S1 is noted.
IMPRESSION: No acute abnormality.  Negative for evidence of neoplastic process.

Findings consistent with remote, high-grade tear of the right
obturator internus with secondary heterotopic ossification.

Findings most consistent with remote strain of the hip adductors on
the left with associated opacification of the distal adductor
tendon.

## 2015-06-21 ENCOUNTER — Other Ambulatory Visit: Payer: Self-pay

## 2016-04-18 ENCOUNTER — Encounter: Payer: BC Managed Care – PPO | Attending: Family Medicine

## 2016-04-18 VITALS — Ht 68.0 in | Wt 207.0 lb

## 2016-04-18 DIAGNOSIS — N19 Unspecified kidney failure: Secondary | ICD-10-CM | POA: Diagnosis not present

## 2016-04-18 DIAGNOSIS — E119 Type 2 diabetes mellitus without complications: Secondary | ICD-10-CM | POA: Diagnosis present

## 2016-04-18 DIAGNOSIS — I10 Essential (primary) hypertension: Secondary | ICD-10-CM | POA: Insufficient documentation

## 2016-04-18 NOTE — Progress Notes (Signed)
Patient was seen on 04/18/2016 for the first of a series of three diabetes self-management courses at the [facility].  Patient Education Plan per assessed needs and concerns is to attend three course education program for Diabetes Self Management Education.  The following learning objectives were met by the patient during this class:  Describe diabetes  State some common risk factors for diabetes  Defines the role of glucose and insulin  Identifies type of diabetes and pathophysiology  Describe the relationship between diabetes and cardiovascular risk  State the members of the Healthcare Team  States the rationale for glucose monitoring  State when to test glucose  State their individual Target Range  State the importance of logging glucose readings  Describe how to interpret glucose readings  Identifies A1C target  Explain the correlation between A1c and eAG values  State symptoms and treatment of high blood glucose  State symptoms and treatment of low blood glucose  Explain proper technique for glucose testing  Identifies proper sharps disposal  Handouts given during class include:  Living Well with Diabetes book  Carb Counting and Meal Planning book  Meal Plan Card  Carbohydrate guide  Meal planning worksheet  Low Sodium Flavoring Tips  The diabetes portion plate  A1c to eAG Conversion Chart  Diabetes Medications  Diabetes Recommended Care Schedule  Support Group  Diabetes Success Plan  Core Class Satisfaction Survey  Follow-Up Plan:  Attend core 2    

## 2016-04-25 ENCOUNTER — Ambulatory Visit: Payer: BC Managed Care – PPO

## 2016-05-02 ENCOUNTER — Ambulatory Visit: Payer: BC Managed Care – PPO

## 2016-05-16 ENCOUNTER — Encounter: Payer: BC Managed Care – PPO | Attending: Family Medicine | Admitting: *Deleted

## 2016-05-16 ENCOUNTER — Encounter: Payer: Self-pay | Admitting: *Deleted

## 2016-05-16 DIAGNOSIS — I10 Essential (primary) hypertension: Secondary | ICD-10-CM | POA: Diagnosis not present

## 2016-05-16 DIAGNOSIS — E119 Type 2 diabetes mellitus without complications: Secondary | ICD-10-CM | POA: Diagnosis present

## 2016-05-16 DIAGNOSIS — N19 Unspecified kidney failure: Secondary | ICD-10-CM | POA: Diagnosis not present

## 2016-05-16 NOTE — Progress Notes (Signed)

## 2016-05-23 ENCOUNTER — Ambulatory Visit: Payer: BC Managed Care – PPO

## 2016-06-13 ENCOUNTER — Encounter: Payer: Self-pay | Admitting: Skilled Nursing Facility1

## 2016-06-13 ENCOUNTER — Encounter: Payer: BC Managed Care – PPO | Attending: Family Medicine | Admitting: Skilled Nursing Facility1

## 2016-06-13 DIAGNOSIS — N19 Unspecified kidney failure: Secondary | ICD-10-CM | POA: Diagnosis not present

## 2016-06-13 DIAGNOSIS — E119 Type 2 diabetes mellitus without complications: Secondary | ICD-10-CM | POA: Insufficient documentation

## 2016-06-13 DIAGNOSIS — I10 Essential (primary) hypertension: Secondary | ICD-10-CM | POA: Diagnosis not present

## 2016-06-13 NOTE — Progress Notes (Signed)
Patient was seen on 06/13/2016 for the third of a series of three diabetes self-management courses at the [facility]. The following learning objectives were met by the patient during this class:  . State the amount of activity recommended for healthy living . Describe activities suitable for individual needs . Identify ways to regularly incorporate activity into daily life . Identify barriers to activity and ways to over come these barriers  Identify diabetes medications being personally used and their primary action for lowering glucose and possible side effects . Describe role of stress on blood glucose and develop strategies to address psychosocial issues . Identify diabetes complications and ways to prevent them  Explain how to manage diabetes during illness . Evaluate success in meeting personal goal . Establish 2-3 goals that they will plan to diligently work on until they return for the  56-monthfollow-up visit  Goals:   I will count my carb choices at most meals and snacks  I will be active 30 minutes or more 5 times a week  I will eat less unhealthy fats by eating less bad food  I will test my glucose at least 4 times a day, 7 days a week  I will look at patterns in my record book at least 4 days a month  To help manage stress I will  meditate at least 4 times a week  Your patient has identified these potential barriers to change:  Stress  Your patient has identified their diabetes self-care support plan as  [facility] On-line Resources Plan:  Attend Monthly Diabetes Support Group as needed or make a future follow up appointment

## 2016-06-30 ENCOUNTER — Ambulatory Visit: Payer: BC Managed Care – PPO

## 2016-07-01 ENCOUNTER — Ambulatory Visit: Payer: BC Managed Care – PPO
